# Patient Record
Sex: Female | Born: 1970 | Race: White | Hispanic: No | Marital: Married | Smoking: Never smoker
Health system: Southern US, Community
[De-identification: ages and names within clinical notes are randomized; demographics above are authoritative.]

---

## 2004-06-29 ENCOUNTER — Other Ambulatory Visit: Admission: RE | Admit: 2004-06-29 | Discharge: 2004-06-29 | Payer: Self-pay | Admitting: Obstetrics and Gynecology

## 2005-07-24 ENCOUNTER — Other Ambulatory Visit: Admission: RE | Admit: 2005-07-24 | Discharge: 2005-07-24 | Payer: Self-pay | Admitting: Obstetrics and Gynecology

## 2006-10-21 ENCOUNTER — Other Ambulatory Visit: Admission: RE | Admit: 2006-10-21 | Discharge: 2006-10-21 | Payer: Self-pay | Admitting: Obstetrics and Gynecology

## 2006-10-22 ENCOUNTER — Ambulatory Visit (HOSPITAL_COMMUNITY): Admission: RE | Admit: 2006-10-22 | Discharge: 2006-10-22 | Payer: Self-pay | Admitting: Obstetrics and Gynecology

## 2008-01-29 ENCOUNTER — Encounter: Admission: RE | Admit: 2008-01-29 | Discharge: 2008-01-29 | Payer: Self-pay | Admitting: Geriatric Medicine

## 2009-10-26 IMAGING — CT CT PELVIS W/O CM
2 of 4 series · 17 of 46 positions shown, 19 images · non-contrast
Comparison: None available.

CT ABDOMEN

CLINICAL DATA: Left flank pain.

CT ABDOMEN AND PELVIS WITHOUT CONTRAST
TECHNIQUE: Multidetector CT imaging of the abdomen and pelvis was
performed following the standard
protocol without intravenous contrast.

[Series 3: stone · axial · 0.58mm/px · z∈[-372,-36]mm · 14 of 72 slices shown, 16 images]
[im 3/72  soft-tissue]
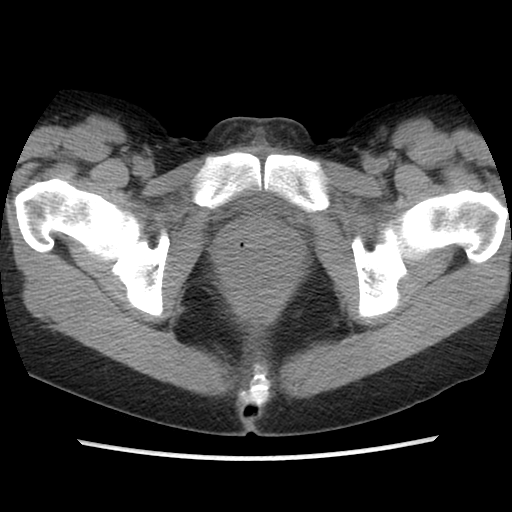
[im 3/72  bone]
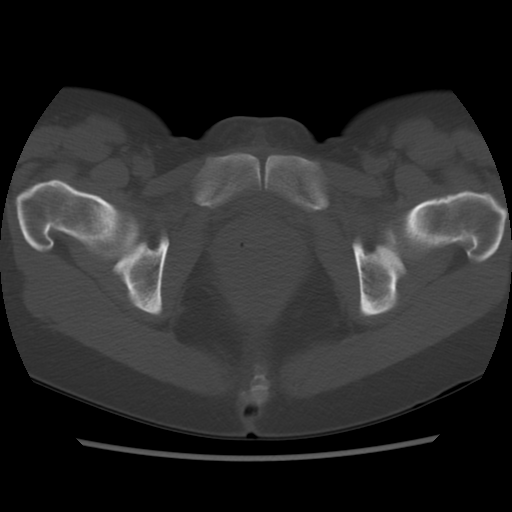
[im 9/72  soft-tissue]
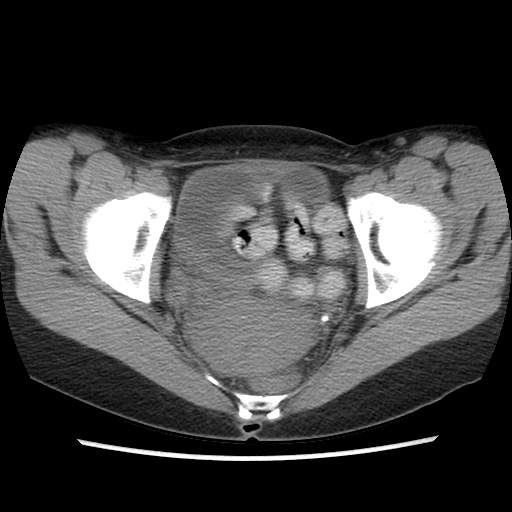
[im 15/72  soft-tissue]
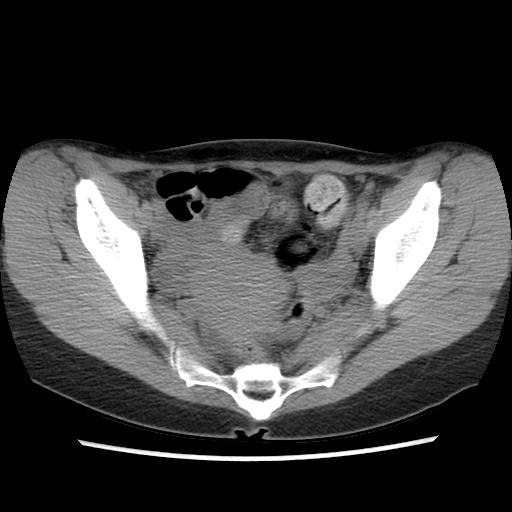
[im 20/72  soft-tissue]
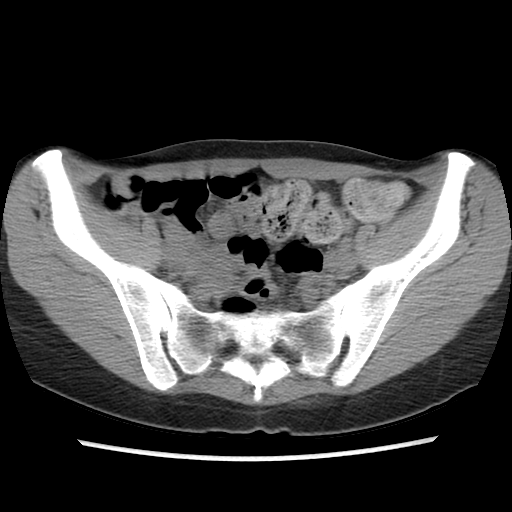
[im 23/72  soft-tissue]
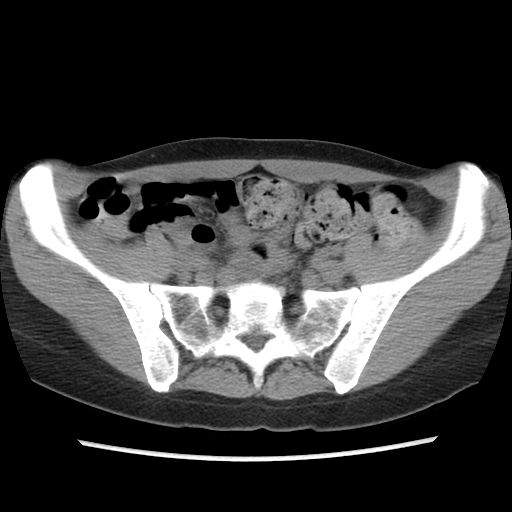
[im 29/72  soft-tissue]
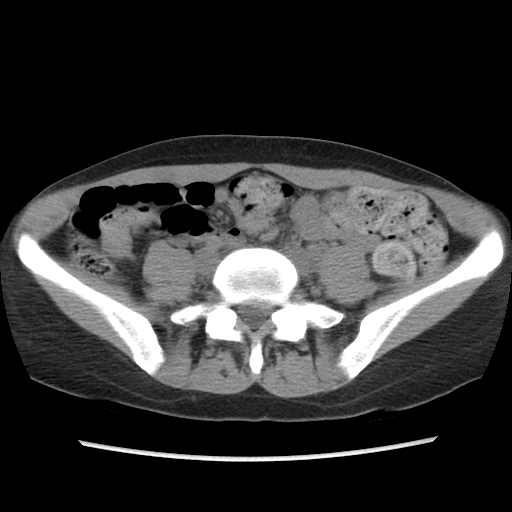
[im 35/72  soft-tissue]
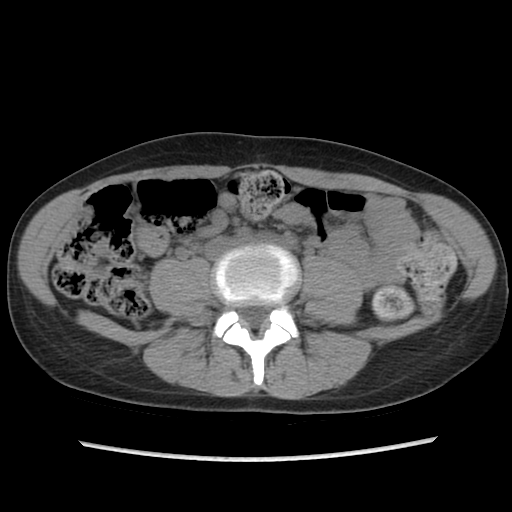
[im 37/72  soft-tissue]
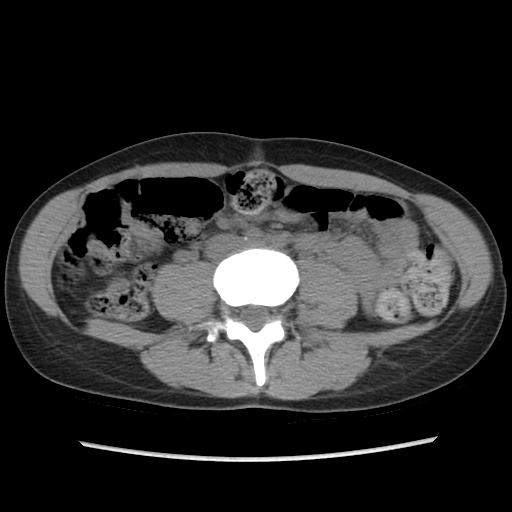
[im 43/72  soft-tissue]
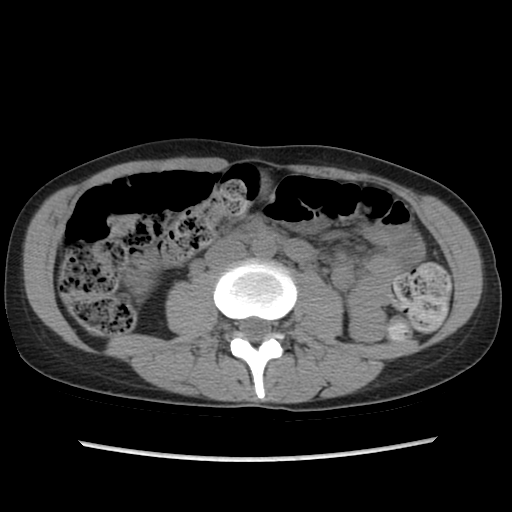
[im 43/72  bone]
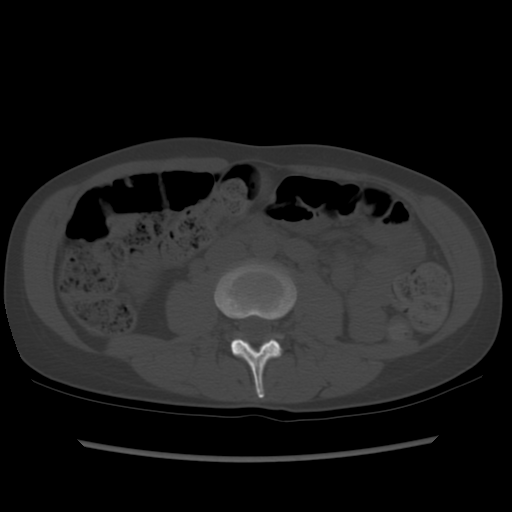
[im 49/72  soft-tissue]
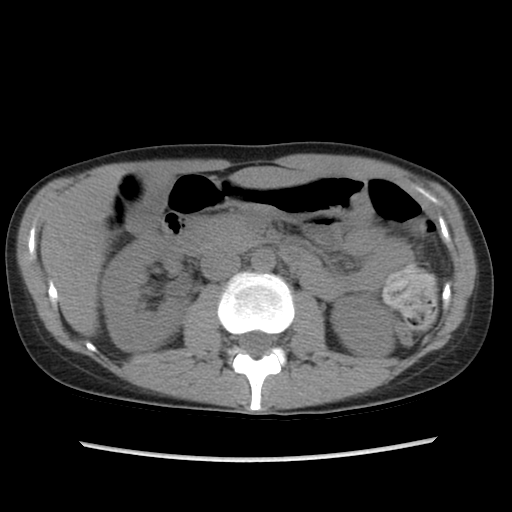
[im 54/72  soft-tissue]
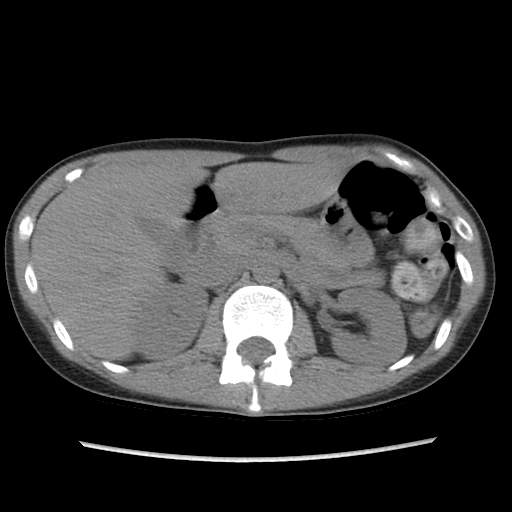
[im 57/72  soft-tissue]
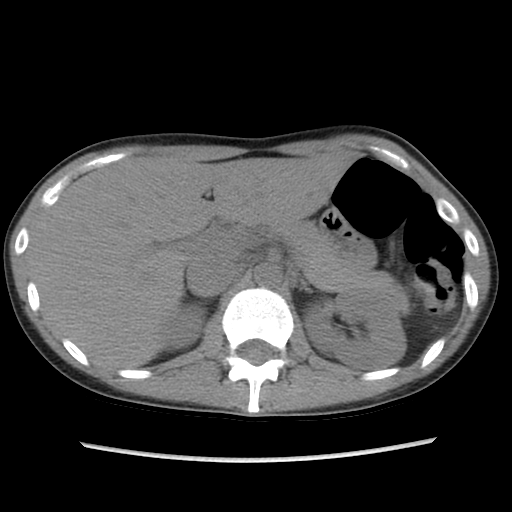
[im 63/72  soft-tissue]
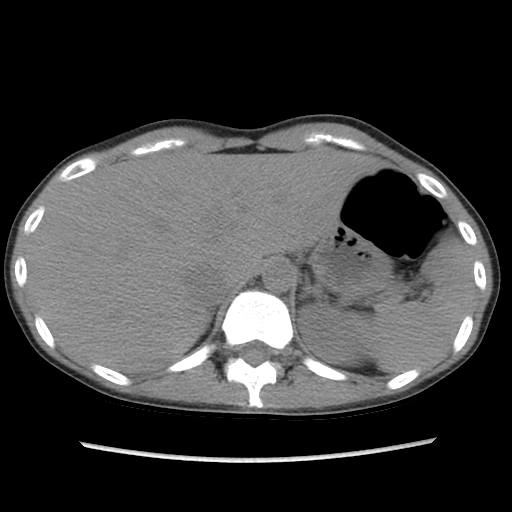
[im 69/72  soft-tissue]
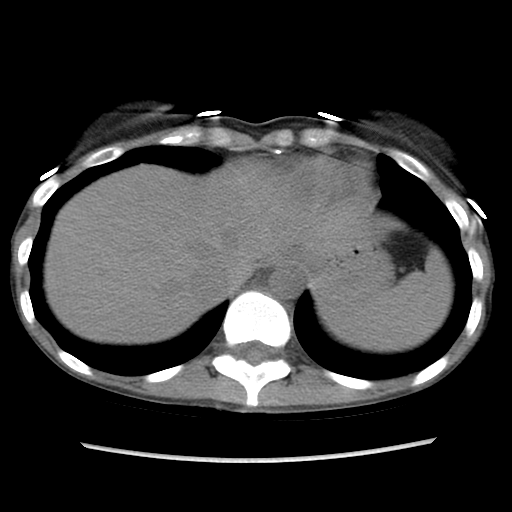

[Series 602: sagittal body · sagittal · 0.82mm/px · 3 of 120 slices shown]
[im 40/120  soft-tissue]
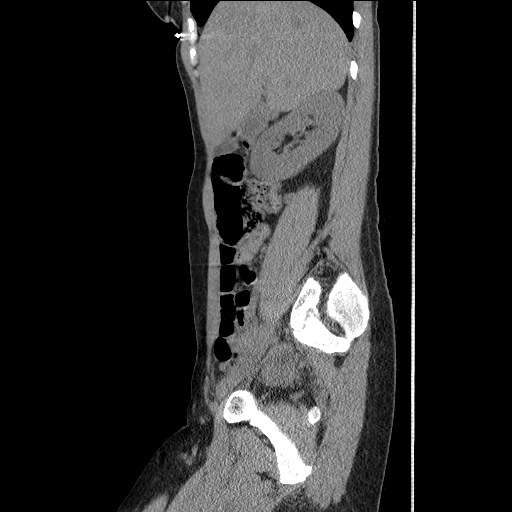
[im 53/120  soft-tissue]
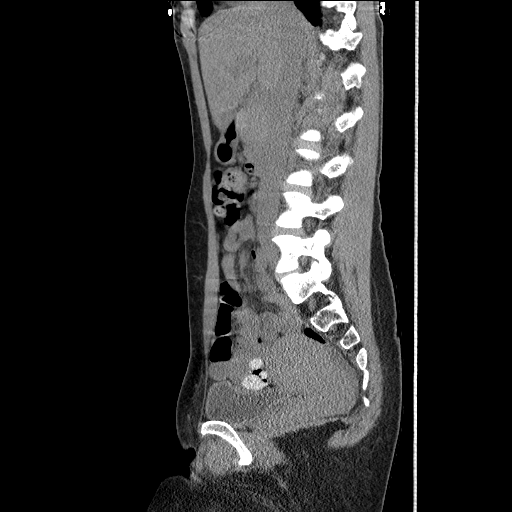
[im 67/120  soft-tissue]
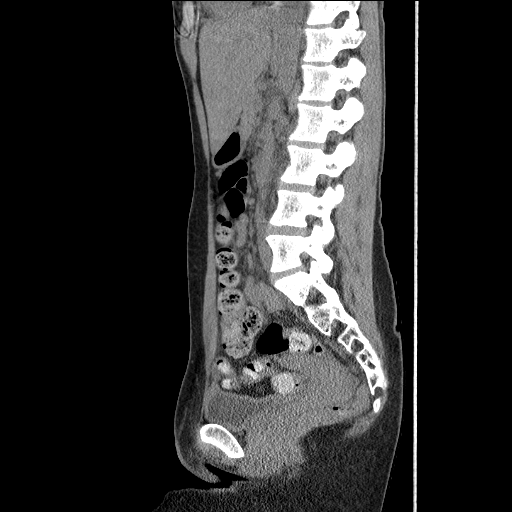

[17 of 46 positions shown; findings below may reference images not displayed]

FINDINGS: Imaged lung parenchyma is clear.  No pleural effusion is
identified.

The kidneys have normal uninfused appearance bilaterally without
hydronephrosis or stone.  No ureteral stones are identified.  The
imaged portions of the liver, gallbladder, spleen, pancreas and
adrenal glands all appear normal.  Stomach and small bowel have a
normal CT appearance.  There is no abdominal lymphadenopathy or
fluid collection.  No focal bony abnormality.
IMPRESSION: No acute finding.  Negative for urinary tract stones.

CT PELVIS
FINDINGS: The uterus and adnexa appear normal.  Urinary bladder is
unremarkable.  The colon has an unremarkable CT appearance.  The
appendix is not discretely visualized but there is no inflammatory
change in the right lower quadrant.  Tiny amount of free pelvic
fluid is likely physiologic.  No focal bony abnormality.
IMPRESSION: Negative pelvic CT.

## 2015-08-03 ENCOUNTER — Ambulatory Visit: Payer: Managed Care, Other (non HMO) | Admitting: Podiatry

## 2016-07-12 ENCOUNTER — Emergency Department (HOSPITAL_COMMUNITY)
Admission: EM | Admit: 2016-07-12 | Discharge: 2016-07-12 | Disposition: A | Discharge status: Home / Self Care | Payer: Self-pay

## 2016-07-12 NOTE — ED Notes (Signed)
Pt reports she feels better and does not wish to be seen or evaluated.

## 2019-07-06 ENCOUNTER — Other Ambulatory Visit: Payer: Self-pay

## 2019-07-06 DIAGNOSIS — Z20822 Contact with and (suspected) exposure to covid-19: Secondary | ICD-10-CM

## 2019-07-08 LAB — NOVEL CORONAVIRUS, NAA: SARS-CoV-2, NAA: NOT DETECTED

## 2019-11-11 ENCOUNTER — Ambulatory Visit: Payer: Self-pay | Attending: Internal Medicine

## 2019-11-11 DIAGNOSIS — Z23 Encounter for immunization: Secondary | ICD-10-CM

## 2019-11-11 NOTE — Progress Notes (Signed)
   Covid-19 Vaccination Clinic  Name:  Destiny Kemp    MRN: [id number] DOB: 1971/05/14  11/11/2019  [name] was observed post Covid-19 immunization for 15 minutes without incident. She was provided with Vaccine Information Sheet and instruction to access the V-Safe system.   [name] was instructed to call 911 with any severe reactions post vaccine: Marland Kitchen Difficulty breathing  . Swelling of face and throat  . A fast heartbeat  . A bad rash all over body  . Dizziness and weakness   Immunizations Administered    Name Date Dose VIS Date Route   Pfizer COVID-19 Vaccine 11/11/2019  9:05 AM 0.3 mL 08/13/2019 Intramuscular   Manufacturer: [employer], [employer]   Lot: [id number]   NDC: [address]-1

## 2019-12-07 ENCOUNTER — Ambulatory Visit: Payer: Self-pay | Attending: Internal Medicine

## 2019-12-07 DIAGNOSIS — Z23 Encounter for immunization: Secondary | ICD-10-CM

## 2019-12-07 NOTE — Progress Notes (Signed)
   Covid-19 Vaccination Clinic  Name:  DARLYS BUIS    MRN: [id number] DOB: Nov 28, 1970  12/07/2019  Ms. Zwicker was observed post Covid-19 immunization for 15 minutes without incident. She was provided with Vaccine Information Sheet and instruction to access the V-Safe system.   Ms. Marin was instructed to call 911 with any severe reactions post vaccine: Marland Kitchen Difficulty breathing  . Swelling of face and throat  . A fast heartbeat  . A bad rash all over body  . Dizziness and weakness   Immunizations Administered    Name Date Dose VIS Date Route   Pfizer COVID-19 Vaccine 12/07/2019  8:42 AM 0.3 mL 08/13/2019 Intramuscular   Manufacturer: [employer], [employer]   Lot: [id number]   NDC: [address]-0

## 2022-09-25 ENCOUNTER — Other Ambulatory Visit: Payer: Self-pay | Admitting: *Deleted

## 2022-09-25 DIAGNOSIS — N289 Disorder of kidney and ureter, unspecified: Secondary | ICD-10-CM

## 2022-10-07 ENCOUNTER — Encounter (HOSPITAL_COMMUNITY): Payer: Self-pay

## 2022-10-09 ENCOUNTER — Encounter: Payer: Self-pay | Admitting: Vascular Surgery

## 2022-10-09 ENCOUNTER — Ambulatory Visit: Payer: BC Managed Care – PPO | Admitting: Vascular Surgery

## 2022-10-09 ENCOUNTER — Ambulatory Visit (HOSPITAL_COMMUNITY)
Admission: RE | Admit: 2022-10-09 | Discharge: 2022-10-09 | Disposition: A | Discharge status: Home / Self Care | Payer: BC Managed Care – PPO | Source: Ambulatory Visit | Attending: Vascular Surgery | Admitting: Vascular Surgery

## 2022-10-09 VITALS — BP 120/74 | HR 60 | Temp 98.2°F | Resp 20 | Ht 63.0 in | Wt 113.0 lb

## 2022-10-09 DIAGNOSIS — I722 Aneurysm of renal artery: Secondary | ICD-10-CM | POA: Diagnosis not present

## 2022-10-09 DIAGNOSIS — N289 Disorder of kidney and ureter, unspecified: Secondary | ICD-10-CM | POA: Insufficient documentation

## 2022-10-09 NOTE — Progress Notes (Signed)
Patient ID: Destiny Kemp, female   DOB: May 14, 1971, 52 y.o.   MRN: [id number]  Reason for Consult: New Patient (Initial Visit)   Referred by Brien Few, MD  Subjective:     HPI:  Destiny Kemp is a 52 y.o. 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  She now follows up with ultrasound.  She denies any medical issues.  History reviewed. No pertinent past medical history. History reviewed. No pertinent family history. History reviewed. No pertinent surgical history.  Short Social History:  Social History   Tobacco Use   Smoking status: Never   Smokeless tobacco: Never  Substance Use Topics   Alcohol use: Yes    No Known Allergies  No current outpatient medications on file.   No current facility-administered medications for this visit.    Review of Systems  Constitutional:  Constitutional negative. HENT: HENT negative.  Eyes: Eyes negative.  Respiratory: Respiratory negative.  GI: Gastrointestinal negative.  Musculoskeletal: Musculoskeletal negative.  Skin: Skin negative.  Neurological: Neurological negative. Hematologic: Hematologic/lymphatic negative.  Psychiatric: Psychiatric negative.        Objective:  Objective   Vitals:   10/09/22 0940  BP: 120/74  Pulse: 60  Resp: 20  Temp: 98.2 F (36.8 C)  SpO2: 99%  Weight: 113 lb (51.3 kg)  Height: 5\' 3"  (1.6 m)   Body mass index is 20.02 kg/m.  Physical Exam Eyes:     Pupils: Pupils are equal, round, and reactive to light.  Cardiovascular:     Rate and Rhythm: Normal rate.     Pulses: Normal pulses.  Pulmonary:     Effort: Pulmonary effort  is normal.     Breath sounds: Normal breath sounds.  Abdominal:     General: Abdomen is flat.     Palpations: Abdomen is soft.  Musculoskeletal:        General: Normal range of motion.     Cervical back: Normal range of motion and neck supple.     Right lower leg: No edema.     Left lower leg: No edema.  Skin:    General: Skin is warm and dry.     Capillary Refill: Capillary refill takes less than 2 seconds.  Neurological:     Mental Status: She is alert.  Psychiatric:        Mood and Affect: Mood normal.        Behavior: Behavior normal.        Thought Content: Thought content normal.        Judgment: Judgment normal.     Data: Duplex Findings:  +----------+--------+--------+------+--------+  MesentericPSV cm/sEDV cm/sPlaqueComments  +----------+--------+--------+------+--------+  Aorta Prox   79                           +----------+--------+--------+------+--------+           +------------------+--------+--------+-------+  Right Renal ArteryPSV cm/sEDV cm/sComment  +------------------+--------+--------+-------+  Origin              97      34            +------------------+--------+--------+-------+  Proximal            86      18            +------------------+--------+--------+-------+  Mid                128      48            +------------------+--------+--------+-------+  Distal             113      41            +------------------+--------+--------+-------+   +-----------------+--------+--------+-------+  Left Renal ArteryPSV cm/sEDV cm/sComment  +-----------------+--------+--------+-------+  Origin             55      11            +-----------------+--------+--------+-------+  Proximal           99      44            +-----------------+--------+--------+-------+  Mid               102      39            +-----------------+--------+--------+-------+  Distal            179      47             +-----------------+--------+--------+-------+   +------------+--------+--------+----+-----------+--------+--------+----+  Right KidneyPSV cm/sEDV cm/sRI  Left KidneyPSV cm/sEDV cm/sRI    +------------+--------+--------+----+-----------+--------+--------+----+  Upper Pole  49      22      0.55Upper Pole 23      7       0.70  +------------+--------+--------+----+-----------+--------+--------+----+  Mid        56      19      0.66Mid        18      5       0.72  +------------+--------+--------+----+-----------+--------+--------+----+  Lower Pole  24      8       0.67Lower Pole 21      7       0.67  +------------+--------+--------+----+-----------+--------+--------+----+  Hilar                          Hilar                            +------------+--------+--------+----+-----------+--------+--------+----+   +------------------+---------+------------------+----------+  Right Kidney               Left Kidney                   +------------------+---------+------------------+----------+  RAR                       RAR                           +------------------+---------+------------------+----------+  RAR (manual)      1.62     RAR (manual)      2.27        +------------------+---------+------------------+----------+  Cortex           20/8 cm/sCortex            31/12 cm/s  +------------------+---------+------------------+----------+  Cortex thickness           Corex thickness               +------------------+---------+------------------+----------+  Kidney length (cm)9.50     Kidney length (cm)9.02        +------------------+---------+------------------+----------+     Summary:  Renal:    Right: Normal size right kidney. RRV flow present. No evidence of         right renal artery stenosis. Area of possible dilatation of         the right renal artery noted in the distal segment, measuring         0.56 x  0.57cm; refer to images 16-18.  Left:  Normal size of left kidney. LRV flow present. 1-59% stenosis         of the left renal artery.      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  Unknown timeframe of presence of aneurysm.  No CT images available for review today interpreted duplex.  Patient without any plans for future pregnancies.  As such we will follow-up in 2 years with repeat duplex at which time can consider longer interval follow-up.     Waynetta Sandy MD [facility]
